# Patient Record
Sex: Female | Born: 2000 | Race: White | Hispanic: No | Marital: Single | Smoking: Never smoker
Health system: Southern US, Community
[De-identification: ages and names within clinical notes are randomized; demographics above are authoritative.]

---
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
## 2018-10-08 ENCOUNTER — Encounter (HOSPITAL_COMMUNITY): Payer: Self-pay | Admitting: Emergency Medicine

## 2018-10-08 ENCOUNTER — Ambulatory Visit (HOSPITAL_COMMUNITY)
Admission: EM | Admit: 2018-10-08 | Discharge: 2018-10-08 | Disposition: A | Discharge status: Home / Self Care | Payer: BC Managed Care – PPO

## 2018-10-08 ENCOUNTER — Other Ambulatory Visit: Payer: Self-pay

## 2018-10-08 DIAGNOSIS — S161XXA Strain of muscle, fascia and tendon at neck level, initial encounter: Secondary | ICD-10-CM

## 2018-10-08 MED ORDER — CYCLOBENZAPRINE HCL 5 MG PO TABS: 5.0000 mg | ORAL_TABLET | Freq: Three times a day (TID) | ORAL | 0 refills | Status: AC | PRN

## 2018-10-08 MED ORDER — KETOROLAC TROMETHAMINE 60 MG/2ML IM SOLN
INTRAMUSCULAR | Status: AC
  Filled 2018-10-08: qty 2

## 2018-10-08 MED ORDER — KETOROLAC TROMETHAMINE 60 MG/2ML IM SOLN
60.0000 mg | Freq: Once | INTRAMUSCULAR | Status: AC
  Administered 2018-10-08: 60 mg via INTRAMUSCULAR

## 2018-10-08 NOTE — Discharge Instructions (Addendum)
Do neck exercises daily. Take medication as prescribed. Do not take cyclobenzaprine with alcohol or while driving as it may make you drowsy. Take ibuprofen every 6 to 8 hours as needed for pain - take 1st dose tomorrow morning If no improvement follow up with PCP in 1 week for reevaluation and possible referral to physical therapy.
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
## 2018-12-30 ENCOUNTER — Emergency Department (HOSPITAL_COMMUNITY)
Admission: EM | Admit: 2018-12-30 | Discharge: 2018-12-30 | Disposition: A | Discharge status: Home / Self Care | Payer: BC Managed Care – PPO | Attending: Emergency Medicine | Admitting: Emergency Medicine

## 2018-12-30 ENCOUNTER — Encounter (HOSPITAL_COMMUNITY): Payer: Self-pay

## 2018-12-30 ENCOUNTER — Other Ambulatory Visit: Payer: Self-pay

## 2018-12-30 DIAGNOSIS — Y999 Unspecified external cause status: Secondary | ICD-10-CM | POA: Diagnosis not present

## 2018-12-30 DIAGNOSIS — Y939 Activity, unspecified: Secondary | ICD-10-CM | POA: Diagnosis not present

## 2018-12-30 DIAGNOSIS — Z23 Encounter for immunization: Secondary | ICD-10-CM | POA: Insufficient documentation

## 2018-12-30 DIAGNOSIS — Y9289 Other specified places as the place of occurrence of the external cause: Secondary | ICD-10-CM | POA: Diagnosis not present

## 2018-12-30 DIAGNOSIS — S81831A Puncture wound without foreign body, right lower leg, initial encounter: Secondary | ICD-10-CM | POA: Diagnosis not present

## 2018-12-30 DIAGNOSIS — W540XXA Bitten by dog, initial encounter: Secondary | ICD-10-CM | POA: Diagnosis not present

## 2018-12-30 MED ORDER — AMOXICILLIN-POT CLAVULANATE 875-125 MG PO TABS: 1.0000 | ORAL_TABLET | Freq: Two times a day (BID) | ORAL | 0 refills | Status: AC

## 2018-12-30 MED ORDER — TETANUS-DIPHTH-ACELL PERTUSSIS 5-2.5-18.5 LF-MCG/0.5 IM SUSP
0.5000 mL | Freq: Once | INTRAMUSCULAR | Status: AC
  Administered 2018-12-30: 22:00:00 0.5 mL via INTRAMUSCULAR
  Filled 2018-12-30: qty 0.5

## 2018-12-30 MED ORDER — AMOXICILLIN-POT CLAVULANATE 875-125 MG PO TABS
1.0000 | ORAL_TABLET | Freq: Once | ORAL | Status: AC
  Administered 2018-12-30: 1 via ORAL
  Filled 2018-12-30: qty 1

## 2018-12-30 NOTE — Discharge Instructions (Addendum)
You were seen in the ER after dog bite to the leg.  Pain, swelling and bruising is typical after a bite.  No signs of infection currently.  Any bite is high risk for infection, take Augmentin as prescribed.  Your tetanus shot was updated today.  You declined rabies series, make sure dog is up-to-date.  Return for worsening swelling redness warmth pus fevers to the area.  Can apply ice and alternate ibuprofen and acetaminophen as needed for pain.

## 2018-12-30 NOTE — ED Triage Notes (Signed)
Pt presents with c/o dog bite that occurred yesterday. Pt was bitten on the back of her left leg below her knee yesterday. Pt reports the bite did break the skin. Area is bruised today. Pt reports the animal was at the shelter where she was visiting and the shelter staff reported that dog did have shots.

## 2018-12-31 NOTE — ED Provider Notes (Signed)
[facility]-EMERGENCY DEPT Provider Note   CSN: [id number] Arrival date & time: 12/30/18  1603     History   Chief Complaint Chief Complaint  Patient presents with  . Animal Bite    HPI Laurie Jordan is a 18 y.o. female presents today for evaluation of dog bite that occurred yesterday.  [place] the [place] and a stray dog bit her in the right calf.  She has filed a report.  Reports associated swelling, bruising and pain.  Unknown tetanus status.  She is 99% sure the dog is up-to-date on rabies vaccination.  Denies any other injuries.  No interventions or modifying factors.     HPI  History reviewed. No pertinent past medical history.  There are no active problems to display for this patient.   History reviewed. No pertinent surgical history.   OB History   No obstetric history on file.      Home Medications    Prior to Admission medications   Medication Sig Start Date End Date Taking? Authorizing Provider  amoxicillin-clavulanate (AUGMENTIN) 875-125 MG tablet Take 1 tablet by mouth every 12 (twelve) hours. 12/30/18   Liberty Handy, PA-C  cyclobenzaprine (FLEXERIL) 5 MG tablet Take 1 tablet (5 mg total) by mouth 3 (three) times daily as needed for muscle spasms. 10/08/18   Evern Core, PA-C  Etonogestrel [facility]) Inject into the skin.    [provider]  ondansetron (ZOFRAN ODT) 8 MG disintegrating tablet Take 1 tablet (8 mg total) by mouth every 8 (eight) hours as needed for nausea or vomiting. 06/09/17   Jaynie Crumble, PA-C    Family History History reviewed. No pertinent family history.  Social History Social History   Tobacco Use  . Smoking status: Never Smoker  . Smokeless tobacco: Never Used  Substance Use Topics  . Alcohol use: Not Currently  . Drug use: Never     Allergies   Patient has no known allergies.   Review of Systems Review of Systems  Skin: Positive for wound.  All other systems  reviewed and are negative.    Physical Exam Updated Vital Signs BP 125/76   Pulse (!) 58   Temp 97.9 F (36.6 C) (Oral)   Resp 16   SpO2 100%   Physical Exam Constitutional:      Appearance: She is well-developed.  HENT:     Head: Normocephalic.     Nose: Nose normal.  Eyes:     General: Lids are normal.  Neck:     Musculoskeletal: Normal range of motion.  Cardiovascular:     Rate and Rhythm: Normal rate.     Comments: Left lower extremity is warm with brisk cap refill.  No focal calf tenderness or asymmetry other than local ecchymosis and tenderness around the left calf bite wound. Pulmonary:     Effort: Pulmonary effort is normal. No respiratory distress.  Musculoskeletal: Normal range of motion.     Comments: Proximal knee and distal ankle nontender with full range of motion.  Compartments of the left calf soft and nontender.  Skin:    Comments: 2 puncture wounds on the right posterior/medial calf with surrounding area of purple/green ecchymosis, tenderness.  No erythema, warmth, drainage or bleeding.  Neurological:     Mental Status: She is alert.     Comments: Sensation and strength intact in left lower extremity  Psychiatric:        Behavior: Behavior normal.      ED  Treatments / Results  Labs (all labs ordered are listed, but only abnormal results are displayed) Labs Reviewed - No data to display  EKG None  Radiology No results found.  Procedures Procedures (including critical care time)  Medications Ordered in ED Medications  Tdap (BOOSTRIX) injection 0.5 mL (0.5 mLs Intramuscular Given 12/30/18 2152)  amoxicillin-clavulanate (AUGMENTIN) 875-125 MG per tablet 1 tablet (1 tablet Oral Given 12/30/18 2152)     Initial Impression / Assessment and Plan / ED Course  I have reviewed the triage vital signs and the nursing notes.  Pertinent labs & imaging results that were available during my care of the patient were reviewed by me and considered in my  medical decision making (see chart for details).  Mild local skin inflammatory changes after dog bite without signs of infection, abscess, cellulitis.  Compartments are soft.  Extremities neurovascularly intact.  No think there is indication for emergent imaging.  No signs of constitutional symptoms to suggest systemic infection.  Her tetanus was updated today.  Patient reports being almost certain the dogs rabies series is up-to-date and declined rabies series here.  Encouraged her to confirm this over the next couple of days to ensure she does not need rabies vaccinations.  Discussed wound care at home.  Return precautions discussed.  She is comfortable with this.  Final Clinical Impressions(s) / ED Diagnoses   Final diagnoses:  Dog bite, initial encounter    ED Discharge Orders         Ordered    amoxicillin-clavulanate (AUGMENTIN) 875-125 MG tablet  Every 12 hours     12/30/18 2220           Kinnie Feil, PA-C 12/31/18 2353    Daleen Bo, MD 12/31/18 1103

## 2019-12-06 ENCOUNTER — Emergency Department (HOSPITAL_COMMUNITY)
Admission: EM | Admit: 2019-12-06 | Discharge: 2019-12-07 | Disposition: A | Discharge status: Home / Self Care | Payer: 59 | Attending: Emergency Medicine | Admitting: Emergency Medicine

## 2019-12-06 ENCOUNTER — Other Ambulatory Visit: Payer: Self-pay

## 2019-12-06 ENCOUNTER — Emergency Department (HOSPITAL_COMMUNITY): Payer: 59

## 2019-12-06 DIAGNOSIS — M79642 Pain in left hand: Secondary | ICD-10-CM | POA: Diagnosis not present

## 2019-12-06 DIAGNOSIS — Y92410 Unspecified street and highway as the place of occurrence of the external cause: Secondary | ICD-10-CM | POA: Diagnosis not present

## 2019-12-06 DIAGNOSIS — R519 Headache, unspecified: Secondary | ICD-10-CM | POA: Diagnosis present

## 2019-12-06 DIAGNOSIS — M25561 Pain in right knee: Secondary | ICD-10-CM | POA: Insufficient documentation

## 2019-12-06 DIAGNOSIS — M79605 Pain in left leg: Secondary | ICD-10-CM | POA: Diagnosis not present

## 2019-12-06 LAB — CBC
HCT: 38.1 % (ref 36.0–46.0)
Hemoglobin: 12.4 g/dL (ref 12.0–15.0)
MCH: 28.2 pg (ref 26.0–34.0)
MCHC: 32.5 g/dL (ref 30.0–36.0)
MCV: 86.6 fL (ref 80.0–100.0)
Platelets: 314 10*3/uL (ref 150–400)
RBC: 4.4 MIL/uL (ref 3.87–5.11)
RDW: 12.5 % (ref 11.5–15.5)
WBC: 12.1 10*3/uL — ABNORMAL HIGH (ref 4.0–10.5)
nRBC: 0 % (ref 0.0–0.2)
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
## 2019-12-07 ENCOUNTER — Emergency Department (HOSPITAL_COMMUNITY): Payer: 59

## 2019-12-07 LAB — BASIC METABOLIC PANEL
Anion gap: 10 (ref 5–15)
BUN: 13 mg/dL (ref 6–20)
CO2: 23 mmol/L (ref 22–32)
Calcium: 9.4 mg/dL (ref 8.9–10.3)
Chloride: 104 mmol/L (ref 98–111)
Creatinine, Ser: 0.72 mg/dL (ref 0.44–1.00)
GFR, Estimated: >60 mL/min (ref >60)
Glucose, Bld: 103 mg/dL — ABNORMAL HIGH (ref 70–99)
Potassium: 3.6 mmol/L (ref 3.5–5.1)
Sodium: 137 mmol/L (ref 135–145)

## 2019-12-07 LAB — LACTIC ACID, PLASMA: Lactic Acid, Venous: 0.6 mmol/L (ref 0.5–1.9)

## 2019-12-07 LAB — POC URINE PREG, ED: Preg Test, Ur: NEGATIVE

## 2019-12-07 MED ORDER — OXYCODONE-ACETAMINOPHEN 5-325 MG PO TABS: 2.0000 | ORAL_TABLET | ORAL | 0 refills | Status: AC | PRN

## 2019-12-07 MED ORDER — OXYCODONE-ACETAMINOPHEN 5-325 MG PO TABS
1.0000 | ORAL_TABLET | Freq: Once | ORAL | Status: AC
  Administered 2019-12-07: 1 via ORAL
  Filled 2019-12-07: qty 1
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
